# Patient Record
Sex: Female | Born: 1975 | Race: Black or African American | Hispanic: No | Marital: Married | Smoking: Never smoker
Health system: Southern US, Community
[De-identification: ages and names within clinical notes are randomized; demographics above are authoritative.]

---

## 2001-01-15 ENCOUNTER — Inpatient Hospital Stay (HOSPITAL_COMMUNITY): Admission: AD | Admit: 2001-01-15 | Discharge: 2001-01-15 | Payer: Self-pay | Admitting: *Deleted

## 2002-12-20 ENCOUNTER — Encounter: Payer: Self-pay | Admitting: Emergency Medicine

## 2002-12-20 ENCOUNTER — Emergency Department (HOSPITAL_COMMUNITY): Admission: EM | Admit: 2002-12-20 | Discharge: 2002-12-20 | Payer: Self-pay | Admitting: Emergency Medicine

## 2005-08-08 ENCOUNTER — Other Ambulatory Visit: Admission: RE | Admit: 2005-08-08 | Discharge: 2005-08-08 | Payer: Self-pay | Admitting: Obstetrics and Gynecology

## 2006-02-05 ENCOUNTER — Ambulatory Visit: Payer: Self-pay | Admitting: Sports Medicine

## 2006-02-21 ENCOUNTER — Ambulatory Visit: Payer: Self-pay | Admitting: Family Medicine

## 2006-11-29 DIAGNOSIS — I10 Essential (primary) hypertension: Secondary | ICD-10-CM | POA: Insufficient documentation

## 2006-11-29 DIAGNOSIS — E669 Obesity, unspecified: Secondary | ICD-10-CM | POA: Insufficient documentation

## 2006-11-29 DIAGNOSIS — N949 Unspecified condition associated with female genital organs and menstrual cycle: Secondary | ICD-10-CM

## 2008-06-26 ENCOUNTER — Emergency Department (HOSPITAL_COMMUNITY): Admission: EM | Admit: 2008-06-26 | Discharge: 2008-06-26 | Payer: Self-pay | Admitting: Family Medicine

## 2009-04-13 ENCOUNTER — Ambulatory Visit: Payer: Self-pay | Admitting: Family Medicine

## 2009-04-13 DIAGNOSIS — Q828 Other specified congenital malformations of skin: Secondary | ICD-10-CM | POA: Insufficient documentation

## 2009-07-23 ENCOUNTER — Ambulatory Visit: Payer: Self-pay | Admitting: Family Medicine

## 2009-07-23 ENCOUNTER — Encounter: Payer: Self-pay | Admitting: Family Medicine

## 2009-07-23 DIAGNOSIS — R519 Headache, unspecified: Secondary | ICD-10-CM | POA: Insufficient documentation

## 2009-07-23 DIAGNOSIS — R51 Headache: Secondary | ICD-10-CM

## 2009-07-23 LAB — CONVERTED CEMR LAB
BUN: 13 mg/dL (ref 6–23)
CO2: 24 meq/L (ref 19–32)
Calcium: 9.6 mg/dL (ref 8.4–10.5)
Chloride: 103 meq/L (ref 96–112)
Creatinine, Ser: 0.85 mg/dL (ref 0.40–1.20)
Glucose, Bld: 100 mg/dL — ABNORMAL HIGH (ref 70–99)
Potassium: 4.3 meq/L (ref 3.5–5.3)
Sodium: 138 meq/L (ref 135–145)

## 2009-07-26 ENCOUNTER — Encounter: Payer: Self-pay | Admitting: Family Medicine

## 2010-11-13 ENCOUNTER — Encounter: Payer: Self-pay | Admitting: *Deleted

## 2010-12-19 ENCOUNTER — Inpatient Hospital Stay (INDEPENDENT_AMBULATORY_CARE_PROVIDER_SITE_OTHER)
Admission: RE | Admit: 2010-12-19 | Discharge: 2010-12-19 | Disposition: A | Discharge status: Home / Self Care | Payer: Self-pay | Source: Ambulatory Visit | Attending: Emergency Medicine | Admitting: Emergency Medicine

## 2010-12-19 DIAGNOSIS — M9390 Osteochondropathy, unspecified of unspecified site: Secondary | ICD-10-CM

## 2010-12-19 DIAGNOSIS — R071 Chest pain on breathing: Secondary | ICD-10-CM

## 2013-01-24 ENCOUNTER — Other Ambulatory Visit (HOSPITAL_COMMUNITY)
Admission: RE | Admit: 2013-01-24 | Discharge: 2013-01-24 | Disposition: A | Discharge status: Home / Self Care | Payer: BC Managed Care – PPO | Source: Ambulatory Visit | Attending: Internal Medicine | Admitting: Internal Medicine

## 2013-01-24 ENCOUNTER — Other Ambulatory Visit: Payer: Self-pay

## 2013-01-24 DIAGNOSIS — Z01419 Encounter for gynecological examination (general) (routine) without abnormal findings: Secondary | ICD-10-CM | POA: Insufficient documentation

## 2015-06-13 ENCOUNTER — Ambulatory Visit: Payer: 59

## 2018-10-25 ENCOUNTER — Other Ambulatory Visit: Payer: Self-pay | Admitting: Podiatry

## 2018-10-25 DIAGNOSIS — M7660 Achilles tendinitis, unspecified leg: Secondary | ICD-10-CM

## 2018-11-04 ENCOUNTER — Other Ambulatory Visit: Payer: Self-pay | Admitting: Podiatry

## 2018-11-09 ENCOUNTER — Ambulatory Visit
Admission: RE | Admit: 2018-11-09 | Discharge: 2018-11-09 | Disposition: A | Discharge status: Home / Self Care | Payer: 59 | Source: Ambulatory Visit | Attending: Podiatry | Admitting: Podiatry

## 2018-11-09 DIAGNOSIS — M7660 Achilles tendinitis, unspecified leg: Secondary | ICD-10-CM

## 2021-10-14 ENCOUNTER — Other Ambulatory Visit: Payer: Self-pay | Admitting: Registered Nurse

## 2021-10-14 DIAGNOSIS — E785 Hyperlipidemia, unspecified: Secondary | ICD-10-CM

## 2021-11-21 ENCOUNTER — Encounter: Payer: Self-pay | Admitting: Neurology

## 2021-11-21 ENCOUNTER — Ambulatory Visit (INDEPENDENT_AMBULATORY_CARE_PROVIDER_SITE_OTHER): Payer: 59 | Admitting: Neurology

## 2021-11-21 ENCOUNTER — Other Ambulatory Visit: Payer: Self-pay | Admitting: Gastroenterology

## 2021-11-21 VITALS — BP 152/109 | HR 72 | Ht 64.0 in | Wt 366.0 lb

## 2021-11-21 DIAGNOSIS — Z1211 Encounter for screening for malignant neoplasm of colon: Secondary | ICD-10-CM | POA: Insufficient documentation

## 2021-11-21 DIAGNOSIS — H4710 Unspecified papilledema: Secondary | ICD-10-CM | POA: Diagnosis not present

## 2021-11-21 DIAGNOSIS — B9689 Other specified bacterial agents as the cause of diseases classified elsewhere: Secondary | ICD-10-CM | POA: Insufficient documentation

## 2021-11-21 DIAGNOSIS — N760 Acute vaginitis: Secondary | ICD-10-CM | POA: Insufficient documentation

## 2021-11-21 DIAGNOSIS — N870 Mild cervical dysplasia: Secondary | ICD-10-CM | POA: Insufficient documentation

## 2021-11-21 NOTE — Patient Instructions (Signed)
MRI Brain with and without contrast  MRV Head  Will contact after the results, if they are non-revealing, we will proceed with the lumbar puncture  Return to clinic in 6 months

## 2021-11-21 NOTE — Progress Notes (Signed)
[facility]  PATIENT: Jennifer Henderson DOB: 01-Nov-1975  REQUESTING CLINICIAN: [facility]* HISTORY FROM: Patient  REASON FOR VISIT: Papilledema    HISTORICAL  CHIEF COMPLAINT:  Chief Complaint  Patient presents with   New Patient (Initial Visit)    Rm 12. Alone. NP/Paper/[facility]/Joel Sherral Hammers OD/pseudopapilledema of bilateral optic discs    HISTORY OF PRESENT ILLNESS:  This is a 46 year old woman past medical history of obesity, low vitamin D vitamin D deficiency who was referred by ophthalmology for bilateral pseudopapilledema of optic disc.  Currently patient denies any symptoms, denies any headaches, no back pain, denies any visual obscuration and no complaint.  She did report occasional headache but nothing to send him to the hospital.  She reports seeing ophthalmology for her annual checkup, reported sleep is okay, she does snore but was never told that she has apnea.  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  No other complaints.  OTHER MEDICAL CONDITIONS: Low  Vitamin   REVIEW OF SYSTEMS: Full 14 system review of systems performed and negative with exception of: as noted in the HPI   ALLERGIES: Allergies  Allergen Reactions   Latex     Other reaction(s): itching/ swollwn eyes    HOME MEDICATIONS: Outpatient Medications Prior to Visit  Medication Sig Dispense Refill   Vitamin D, Ergocalciferol, (DRISDOL) 1.25 MG (50000 UNIT) CAPS capsule Take 1 tablet by mouth once a week.     ammonium lactate (LAC-HYDRIN) 12 % lotion Apply topically 2 (two) times daily as needed. to affected areas      No facility-administered medications prior to visit.    PAST MEDICAL HISTORY: History reviewed. No pertinent past medical history.  PAST SURGICAL HISTORY: History reviewed. No pertinent surgical history.  FAMILY HISTORY: Family History  Problem Relation Age of Onset   High blood pressure Mother     SOCIAL  HISTORY: Social History   Socioeconomic History   Marital status: Married    Spouse name: Not on file   Number of children: Not on file   Years of education: Not on file   Highest education level: Not on file  Occupational History   Not on file  Tobacco Use   Smoking status: Never   Smokeless tobacco: Never  Substance and Sexual Activity   Alcohol use: Yes    Comment: occ   Drug use: Not on file   Sexual activity: Not on file  Other Topics Concern   Not on file  Social History Narrative   Not on file   Social Determinants of Health   Financial Resource Strain: Not on file  Food Insecurity: Not on file  Transportation Needs: Not on file  Physical Activity: Not on file  Stress: Not on file  Social Connections: Not on file  Intimate Partner Violence: Not on file    PHYSICAL EXAM  GENERAL EXAM/CONSTITUTIONAL: Vitals:  Vitals:   11/21/21 0933 11/21/21 0937  BP: (!) 150/102 (!) 152/109  Pulse: 66 72  Weight: (!) 366 lb (166 kg)   Height: 5\' 4"  (1.626 m)    Body mass index is 62.82 kg/m. Wt Readings from Last 3 Encounters:  11/21/21 (!) 366 lb (166 kg)   Patient is in no distress; well developed, nourished and groomed; neck is supple  CARDIOVASCULAR: Examination of carotid arteries is normal; no carotid bruits Regular rate and rhythm, no murmurs Examination of peripheral vascular system by observation and palpation is normal  EYES: Pupils round and reactive to light, Visual fields full to confrontation, Extraocular movements intacts,   MUSCULOSKELETAL: Gait, strength, tone, movements noted in Neurologic exam below  NEUROLOGIC: MENTAL STATUS:  No flowsheet data found. awake, alert, oriented to person, place and time recent and remote memory intact normal attention and concentration language fluent, comprehension intact, naming intact fund of knowledge appropriate  CRANIAL NERVE:  Attempted, unable to visualized optic discs.  2nd, 3rd, 4th, 6th -  pupils equal and reactive to light, visual fields full to confrontation, extraocular muscles intact, no nystagmus 5th - facial sensation symmetric 7th - facial strength symmetric 8th - hearing intact 9th - palate elevates symmetrically, uvula midline 11th - shoulder shrug symmetric 12th - tongue protrusion midline  MOTOR:  normal bulk and tone, full strength in the BUE, BLE  SENSORY:  normal and symmetric to light touch, pinprick, temperature, vibration  COORDINATION:  finger-nose-finger, fine finger movements normal  REFLEXES:  deep tendon reflexes present and symmetric  GAIT/STATION:  normal     DIAGNOSTIC DATA (LABS, IMAGING, TESTING) - I reviewed patient records, labs, notes, testing and imaging myself where available.  No results found for: WBC, HGB, HCT, MCV, PLT    Component Value Date/Time   NA 138 07/23/2009 2112   K 4.3 07/23/2009 2112   CL 103 07/23/2009 2112   CO2 24 07/23/2009 2112   GLUCOSE 100 (H) 07/23/2009 2112   BUN 13 07/23/2009 2112   CREATININE 0.85 07/23/2009 2112   CALCIUM 9.6 07/23/2009 2112   No results found for: CHOL, HDL, LDLCALC, LDLDIRECT, TRIG, CHOLHDL No results found for: HGBA1C No results found for: VITAMINB12 No results found for: TSH   ASSESSMENT AND PLAN  46 y.o. year old female with obesity, vitamin D deficiency who was referred by ophthalmology for pseudopapilledema of bilateral optic discs.  Patient denies any symptoms at the moment, denies any headaches, no back pain, no visual obscuration.  She did have occasional headache in the past but nothing to send her to the hospital or emergency department.  I attempted funduscopic exam but unable to visualize optic discs as patient was blinking and unable to tolerate.  I will go ahead and order a MRI brain with and without contrast and MRV.  If both tests are unrevealing then I will proceed with a lumbar puncture for diagnostic and therapeutic.  If imaging and lumbar puncture  confirmed the diagnosis of idiopathic intracranial hypertension, I will start the patient on either Diamox or topiramate.  I will see her in 6 months for follow-up but I will contact her to order the LP after completion of the brain imaging.  Return sooner if worse.   1. Papilledema   2. Morbid obesity [facility])      Patient Instructions  MRI Brain with and without contrast  MRV Head  Will contact after the results, if they are non-revealing, we will proceed with the lumbar puncture  Return to clinic in 6 months    Orders Placed This Encounter  Procedures   MR BRAIN W WO CONTRAST   MR MRV HEAD WO CM    No orders of the defined types were placed in this encounter.   Return in about 6 months (around 05/21/2022).    Alric Ran, MD 11/21/2021, 10:13 AM  [facility] [address], [place], [place] [address] [phone]

## 2021-11-24 ENCOUNTER — Ambulatory Visit
Admission: RE | Admit: 2021-11-24 | Discharge: 2021-11-24 | Disposition: A | Discharge status: Home / Self Care | Payer: No Typology Code available for payment source | Source: Ambulatory Visit | Attending: Registered Nurse | Admitting: Registered Nurse

## 2021-11-24 DIAGNOSIS — E785 Hyperlipidemia, unspecified: Secondary | ICD-10-CM

## 2021-11-28 ENCOUNTER — Telehealth: Payer: Self-pay | Admitting: Neurology

## 2021-11-28 NOTE — Telephone Encounter (Signed)
MR Brain w/wo contrast & MRV Head wo contrast Dr. Edyth Gunnels auth: NPR Ref # [id number]. Patient is scheduled at [facility] for 12/07/21.

## 2021-12-07 ENCOUNTER — Ambulatory Visit: Payer: 59

## 2021-12-07 ENCOUNTER — Other Ambulatory Visit: Payer: Self-pay | Admitting: Neurology

## 2021-12-07 DIAGNOSIS — H4710 Unspecified papilledema: Secondary | ICD-10-CM

## 2021-12-12 ENCOUNTER — Telehealth: Payer: Self-pay | Admitting: *Deleted

## 2021-12-12 ENCOUNTER — Other Ambulatory Visit: Payer: Self-pay | Admitting: Neurology

## 2021-12-12 DIAGNOSIS — H4710 Unspecified papilledema: Secondary | ICD-10-CM

## 2021-12-12 NOTE — Telephone Encounter (Signed)
-----   Message from Windell Norfolk, MD sent at 12/12/2021 11:38 AM EDT ----- ?Please call and advise the patient that the recent MRI Brain and MRV Head we did were within normal limits. In particular, there were no acute findings, such as a stroke, or mass or blood products.  ?Please inform patient that I have ordered a diagnostic FG lumbar puncture. This test will help [place] confirm the diagnosis of idiopathic intracranial hypertension which can cause papilledema.  ?Please remind patient to keep any upcoming appointments or tests and to call us with any interim questions, concerns, problems or updates. Thanks,  ? ?Windell Norfolk, MD ? ? ?

## 2021-12-12 NOTE — Progress Notes (Signed)
Please call and advise the patient that the recent MRI Brain and MRV Head we did were within normal limits. In particular, there were no acute findings, such as a stroke, or mass or blood products.  ?Please inform patient that I have ordered a diagnostic FG lumbar puncture. This test will help [place] confirm the diagnosis of idiopathic intracranial hypertension which can cause papilledema.  ?Please remind patient to keep any upcoming appointments or tests and to call us with any interim questions, concerns, problems or updates. Thanks,  ? ?Windell Norfolk, MD ? ?

## 2021-12-12 NOTE — Telephone Encounter (Signed)
I spoke to the patient and her sister (on [place]). Provided them with the results below. She is willing to proceed with the ordered LP. She is aware to expect a call for scheduling from [facility]. ?

## 2021-12-20 ENCOUNTER — Telehealth: Payer: Self-pay | Admitting: Neurology

## 2021-12-20 NOTE — Telephone Encounter (Signed)
Sent a message to [place] to reach out to the patient to schedule her LP.  ?

## 2022-01-31 ENCOUNTER — Encounter (HOSPITAL_COMMUNITY): Payer: Self-pay | Admitting: Gastroenterology

## 2022-02-03 ENCOUNTER — Ambulatory Visit (HOSPITAL_COMMUNITY)
Admission: RE | Admit: 2022-02-03 | Discharge: 2022-02-03 | Disposition: A | Discharge status: Home / Self Care | Payer: Commercial Managed Care - PPO | Attending: Gastroenterology | Admitting: Gastroenterology

## 2022-02-03 ENCOUNTER — Ambulatory Visit (HOSPITAL_BASED_OUTPATIENT_CLINIC_OR_DEPARTMENT_OTHER): Payer: Commercial Managed Care - PPO | Admitting: Anesthesiology

## 2022-02-03 ENCOUNTER — Encounter (HOSPITAL_COMMUNITY): Payer: Self-pay | Admitting: Gastroenterology

## 2022-02-03 ENCOUNTER — Encounter (HOSPITAL_COMMUNITY)
Admission: RE | Disposition: A | Discharge status: Home / Self Care | Payer: Self-pay | Source: Home / Self Care | Attending: Gastroenterology

## 2022-02-03 ENCOUNTER — Other Ambulatory Visit: Payer: Self-pay

## 2022-02-03 ENCOUNTER — Ambulatory Visit (HOSPITAL_COMMUNITY): Payer: Commercial Managed Care - PPO | Admitting: Anesthesiology

## 2022-02-03 DIAGNOSIS — K5730 Diverticulosis of large intestine without perforation or abscess without bleeding: Secondary | ICD-10-CM | POA: Insufficient documentation

## 2022-02-03 DIAGNOSIS — I10 Essential (primary) hypertension: Secondary | ICD-10-CM

## 2022-02-03 DIAGNOSIS — Z6841 Body Mass Index (BMI) 40.0 and over, adult: Secondary | ICD-10-CM | POA: Diagnosis not present

## 2022-02-03 DIAGNOSIS — Z1211 Encounter for screening for malignant neoplasm of colon: Secondary | ICD-10-CM

## 2022-02-03 DIAGNOSIS — Z800 Family history of malignant neoplasm of digestive organs: Secondary | ICD-10-CM | POA: Diagnosis not present

## 2022-02-03 HISTORY — PX: COLONOSCOPY WITH PROPOFOL: SHX5780

## 2022-02-03 SURGERY — COLONOSCOPY WITH PROPOFOL
Anesthesia: Monitor Anesthesia Care

## 2022-02-03 MED ORDER — PROPOFOL 500 MG/50ML IV EMUL
INTRAVENOUS | Status: DC | PRN
  Administered 2022-02-03: 50 mg via INTRAVENOUS

## 2022-02-03 MED ORDER — PROPOFOL 500 MG/50ML IV EMUL
INTRAVENOUS | Status: DC | PRN
  Administered 2022-02-03: 100 ug/kg/min via INTRAVENOUS

## 2022-02-03 MED ORDER — LACTATED RINGERS IV SOLN: INTRAVENOUS | Status: DC | PRN

## 2022-02-03 MED ORDER — LIDOCAINE HCL (CARDIAC) PF 100 MG/5ML IV SOSY
PREFILLED_SYRINGE | INTRAVENOUS | Status: DC | PRN
  Administered 2022-02-03: 80 mg via INTRAVENOUS

## 2022-02-03 MED ORDER — ONDANSETRON HCL 4 MG/2ML IJ SOLN
INTRAMUSCULAR | Status: DC | PRN
  Administered 2022-02-03: 4 mg via INTRAVENOUS

## 2022-02-03 SURGICAL SUPPLY — 22 items
ELECT REM PT RETURN 9FT ADLT (ELECTROSURGICAL)
ELECTRODE REM PT RTRN 9FT ADLT (ELECTROSURGICAL) IMPLANT
FCP BXJMBJMB 240X2.8X (CUTTING FORCEPS)
FLOOR PAD 36X40 (MISCELLANEOUS) ×2
FORCEPS BIOP RAD 4 LRG CAP 4 (CUTTING FORCEPS) IMPLANT
FORCEPS BIOP RJ4 240 W/NDL (CUTTING FORCEPS)
FORCEPS BXJMBJMB 240X2.8X (CUTTING FORCEPS) IMPLANT
INJECTOR/SNARE I SNARE (MISCELLANEOUS) IMPLANT
LUBRICANT JELLY 4.5OZ STERILE (MISCELLANEOUS) IMPLANT
MANIFOLD NEPTUNE II (INSTRUMENTS) IMPLANT
NDL SCLEROTHERAPY 25GX240 (NEEDLE) IMPLANT
NEEDLE SCLEROTHERAPY 25GX240 (NEEDLE) IMPLANT
PAD FLOOR 36X40 (MISCELLANEOUS) ×2 IMPLANT
PROBE APC STR FIRE (PROBE) IMPLANT
PROBE INJECTION GOLD (MISCELLANEOUS)
PROBE INJECTION GOLD 7FR (MISCELLANEOUS) IMPLANT
SNARE ROTATE MED OVAL 20MM (MISCELLANEOUS) IMPLANT
SYR 50ML LL SCALE MARK (SYRINGE) IMPLANT
TRAP SPECIMEN MUCOUS 40CC (MISCELLANEOUS) IMPLANT
TUBING ENDO SMARTCAP PENTAX (MISCELLANEOUS) IMPLANT
TUBING IRRIGATION ENDOGATOR (MISCELLANEOUS) ×3 IMPLANT
WATER STERILE IRR 1000ML POUR (IV SOLUTION) IMPLANT

## 2022-02-03 NOTE — Anesthesia Postprocedure Evaluation (Signed)
Anesthesia Post Note ? ?Patient: Jennifer Henderson ? ?Procedure(s) Performed: COLONOSCOPY WITH PROPOFOL ? ?  ? ?Patient location during evaluation: Endoscopy ?Anesthesia Type: MAC ?Level of consciousness: awake and alert, patient cooperative and oriented ?Pain management: pain level controlled ?Vital Signs Assessment: post-procedure vital signs reviewed and stable ?Respiratory status: spontaneous breathing, nonlabored ventilation and respiratory function stable ?Cardiovascular status: stable and blood pressure returned to baseline ?Postop Assessment: no apparent nausea or vomiting, adequate PO intake and able to ambulate ?Anesthetic complications: no ? ? ?No notable events documented. ? ?Last Vitals:  ?Vitals:  ? 02/03/22 1017 02/03/22 1030  ?BP: 138/78 (!) 145/97  ?Pulse: 76 60  ?Resp: (!) 21 17  ?Temp: (!) 36.4 ?C   ?SpO2: 100% 100%  ?  ?Last Pain:  ?Vitals:  ? 02/03/22 1030  ?TempSrc:   ?PainSc: 0-No pain  ? ? ?  ?  ?  ?  ?  ?  ? ?Raylie Maddison,E. Josalynn Johndrow ? ? ? ? ?

## 2022-02-03 NOTE — Anesthesia Preprocedure Evaluation (Addendum)
Anesthesia Evaluation  ?Patient identified by MRN, date of birth, ID band ?Patient awake ? ? ? ?Reviewed: ?Allergy & Precautions, NPO status , Patient's Chart, lab work & pertinent test results ? ?History of Anesthesia Complications ?Negative for: history of anesthetic complications ? ?Airway ?Mallampati: I ? ?TM Distance: >3 FB ?Neck ROM: Full ? ? ? Dental ? ?(+) Dental Advisory Given ?  ?Pulmonary ?neg pulmonary ROS,  ?  ?breath sounds clear to auscultation ? ? ? ? ? ? Cardiovascular ?hypertension (no meds, followed by primary care),  ?Rhythm:Regular Rate:Normal ? ? ?  ?Neuro/Psych ? Headaches,   ? GI/Hepatic ?Neg liver ROS,   ?Endo/Other  ?Morbid obesity ? Renal/GU ?negative Renal ROS  ? ?  ?Musculoskeletal ? ? Abdominal ?(+) + obese,   ?Peds ? Hematology ?negative hematology ROS ?(+)   ?Anesthesia Other Findings ? ? Reproductive/Obstetrics ?[place] ? ?  ? ? ? ? ? ? ? ? ? ? ? ? ? ?  ?  ? ? ? ? ? ? ? ?Anesthesia Physical ?Anesthesia Plan ? ?ASA: 3 ? ?Anesthesia Plan: MAC  ? ?Post-op Pain Management: Minimal or no pain anticipated  ? ?Induction:  ? ?PONV Risk Score and Plan: 2 and Treatment may vary due to age or medical condition ? ?Airway Management Planned: Simple Face Mask and Natural Airway ? ?Additional Equipment:  ? ?Intra-op Plan:  ? ?Post-operative Plan:  ? ?Informed Consent: I have reviewed the patients History and Physical, chart, labs and discussed the procedure including the risks, benefits and alternatives for the proposed anesthesia with the patient or authorized representative who has indicated his/her understanding and acceptance.  ? ? ? ?Dental advisory given ? ?Plan Discussed with: CRNA and Surgeon ? ?Anesthesia Plan Comments:   ? ? ? ? ? ?Anesthesia Quick Evaluation ? ?

## 2022-02-03 NOTE — Discharge Instructions (Signed)

## 2022-02-03 NOTE — H&P (Signed)
Fletcher Anon ?HPI: This 46 year old black female presents to the office for colorectal cancer screening. She has 2-3 BM's per day with no obvious blood or mucus in the stool. She has a good appetite and her weight has been stable. She denies having any complaints of abdominal pain, nausea, vomiting, acid reflux, dysphagia or odynophagia. She denies having a family history of celiac sprue or IBD. Her maternal grandmother had colon cancer but the age of diagnosis is not known.  ? ?History reviewed. No pertinent past medical history. ? ?History reviewed. No pertinent surgical history. ? ?Family History  ?Problem Relation Age of Onset  ? High blood pressure Mother   ? ? ?Social History:  reports that she has never smoked. She has never used smokeless tobacco. She reports current alcohol use. No history on file for drug use. ? ?Allergies:  ?Allergies  ?Allergen Reactions  ? Latex Itching and Swelling  ?  Other reaction(s): itching/ swollen eyes  ? ? ?Medications: Scheduled: ?Continuous: ? ?No results found for this or any previous visit (from the past 24 hour(s)).  ? ?No results found. ? ?ROS:  As stated above in the HPI otherwise negative. ? ?Blood pressure (!) 159/103, pulse 63, temperature 97.8 ?F (36.6 ?C), temperature source Temporal, resp. rate 18, height 5\' 4"  (1.626 m), weight (!) 164.7 kg, SpO2 96 %.   ? ?PE: ?Gen: NAD, Alert and Oriented ?HEENT:  [place]/AT, EOMI ?Neck: Supple, no LAD ?Lungs: CTA Bilaterally ?CV: RRR without M/G/R ?ABD: Soft, NTND, +BS ?Ext: No C/C/E ? ?Assessment/Plan: ?1) Screening colonoscopy. ? ?Mohammed Mcandrew D ?02/03/2022, 9:44 AM  ? ? ?  ? ?

## 2022-02-03 NOTE — Transfer of Care (Signed)
Immediate Anesthesia Transfer of Care Note ? ?Patient: Jennifer Henderson ? ?Procedure(s) Performed: Procedure(s): ?COLONOSCOPY WITH PROPOFOL (N/A) ? ?Patient Location: PACU ? ?Anesthesia Type:MAC ? ?Level of Consciousness:  sedated, patient cooperative and responds to stimulation ? ?Airway & Oxygen Therapy:Patient Spontanous Breathing and Patient connected to face mask oxgen ? ?Post-op Assessment:  Report given to PACU RN and Post -op Vital signs reviewed and stable ? ?Post vital signs:  Reviewed and stable ? ?Last Vitals:  ?Vitals:  ? 02/03/22 0933 02/03/22 1017  ?BP: (!) 159/103 138/78  ?Pulse: 63 76  ?Resp: 18 (!) 21  ?Temp: 36.6 ?C (!) 36.4 ?C  ?SpO2: 96% 100%  ? ? ?Complications: No apparent anesthesia complications ? ?

## 2022-02-03 NOTE — Op Note (Signed)
[facility] ?Patient Name: Jennifer Henderson ?Procedure Date: 02/03/2022 ?MRN: [id number] ?Attending MD: Jeani HawkingPatrick Rogena Deupree , MD ?Date of Birth: 1976-02-04 ?CSN: [id number] ?Age: 46 ?Admit Type: Inpatient ?Procedure:                Colonoscopy ?Indications:              Screening for colorectal malignant neoplasm ?Providers:                Jeani HawkingPatrick Kooper Godshall, MD, Benjaman LobeJoy Morgan, RN, Rozetta NunneryAnthony Gillies,  ?                          Technician ?Referring MD:              ?Medicines:                Propofol per Anesthesia ?Complications:            No immediate complications. ?Estimated Blood Loss:     Estimated blood loss: none. ?Procedure:                Pre-Anesthesia Assessment: ?                          - Prior to the procedure, a History and Physical  ?                          was performed, and patient medications and  ?                          allergies were reviewed. The patient's tolerance of  ?                          previous anesthesia was also reviewed. The risks  ?                          and benefits of the procedure and the sedation  ?                          options and risks were discussed with the patient.  ?                          All questions were answered, and informed consent  ?                          was obtained. Prior Anticoagulants: The patient has  ?                          taken no previous anticoagulant or antiplatelet  ?                          agents. ASA Grade Assessment: III - A patient with  ?                          severe systemic disease. After reviewing the risks  ?  and benefits, the patient was deemed in  ?                          satisfactory condition to undergo the procedure. ?                          - Sedation was administered by an anesthesia  ?                          professional. Deep sedation was attained. ?                          After obtaining informed consent, the colonoscope  ?                          was passed under direct  vision. Throughout the  ?                          procedure, the patient's blood pressure, pulse, and  ?                          oxygen saturations were monitored continuously. The  ?                          CF-HQ190L ([id number]) Olympus colonoscope was  ?                          introduced through the anus and advanced to the the  ?                          cecum, identified by appendiceal orifice and  ?                          ileocecal valve. The colonoscopy was performed  ?                          without difficulty. The patient tolerated the  ?                          procedure well. The quality of the bowel  ?                          preparation was evaluated using the BBPS [facility]  ?                          Bowel Preparation Scale) with scores of: Right  ?                          Colon = 3 (entire mucosa seen well with no residual  ?                          staining, small fragments of stool or opaque  ?  liquid), Transverse Colon = 3 (entire mucosa seen  ?                          well with no residual staining, small fragments of  ?                          stool or opaque liquid) and Left Colon = 3 (entire  ?                          mucosa seen well with no residual staining, small  ?                          fragments of stool or opaque liquid). The total  ?                          BBPS score equals 9. The quality of the bowel  ?                          preparation was good. The ileocecal valve,  ?                          appendiceal orifice, and rectum were photographed. ?Scope In: 9:58:32 AM ?Scope Out: 10:11:38 AM ?Scope Withdrawal Time: 0 hours 8 minutes 34 seconds  ?Total Procedure Duration: 0 hours 13 minutes 6 seconds  ?Findings: ?     A few medium-mouthed diverticula were found in the sigmoid colon and  ?     descending colon. ?Impression:               - Diverticulosis in the sigmoid colon and in the  ?                          descending colon. ?                           - No specimens collected. ?Moderate Sedation: ?     Not Applicable - Patient had care per Anesthesia. ?Recommendation:           - Patient has a contact number available for  ?                          emergencies. The signs and symptoms of potential  ?                          delayed complications were discussed with the  ?                          patient. Return to normal activities tomorrow.  ?                          Written discharge instructions were provided to the  ?                          patient. ?                          -  Resume previous diet. ?                          - Continue present medications. ?                          - Repeat colonoscopy in 10 years for screening  ?                          purposes. ?Procedure Code(s):        --- Professional --- ?                          [phone], Colonoscopy, flexible; diagnostic, including  ?                          collection of specimen(s) by brushing or washing,  ?                          when performed (separate procedure) ?Diagnosis Code(s):        --- Professional --- ?                          Z12.11, Encounter for screening for malignant  ?                          neoplasm of colon ?                          K57.30, Diverticulosis of large intestine without  ?                          perforation or abscess without bleeding ?CPT copyright 2019 [employer] Medical Association. All rights reserved. ?The codes documented in this report are preliminary and upon coder review may  ?be revised to meet current compliance requirements. ?Jeani Hawking, MD ?Jeani Hawking, MD ?02/03/2022 10:23:43 AM ?This report has been signed electronically. ?Number of Addenda: 0 ?

## 2022-05-22 ENCOUNTER — Ambulatory Visit: Payer: Commercial Managed Care - PPO | Admitting: Neurology

## 2023-06-10 IMAGING — CT CT CARDIAC CORONARY ARTERY CALCIUM SCORE
2 of 5 series · 7 of 20 positions shown, 9 images · non-contrast
Comparison: None.

CLINICAL DATA: 45-year-old African American female with history of
hyperlipidemia, hypertension and family history of heart disease.

EXAM:
CT CARDIAC CORONARY ARTERY CALCIUM SCORE
TECHNIQUE: Non-contrast imaging through the heart was performed using
prospective ECG gating. Image post processing was performed on an
independent workstation, allowing for quantitative analysis of the
heart and coronary arteries. Note that this exam targets the heart
and the chest was not imaged in its entirety.

[Series 2: calcium scoring 2.00 qr36 bestdiast 70% hrt calciu · axial · 0.39mm/px · z∈[+1665,+1705]mm · 2 of 62 slices shown]
[im 21/62  vessel]
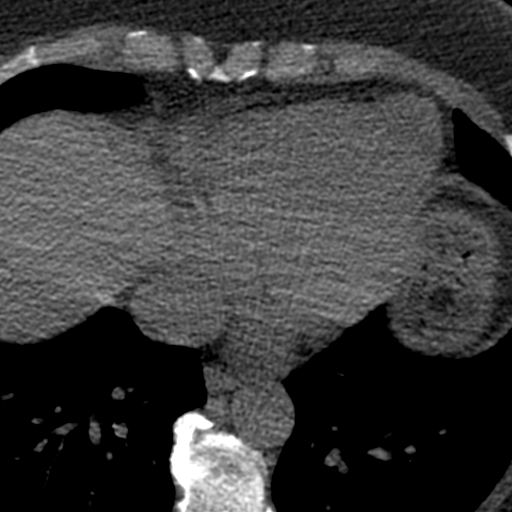
[im 41/62  vessel]
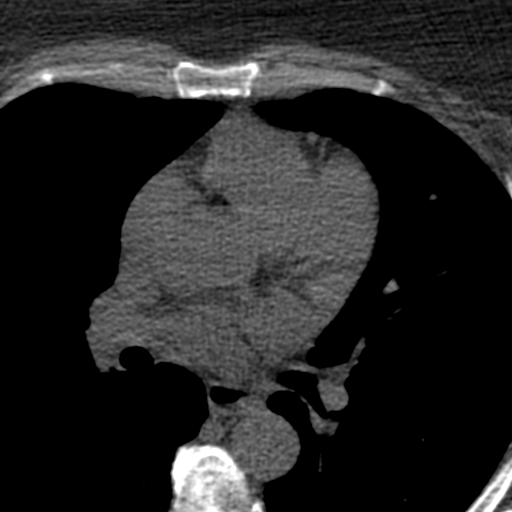

[Series 20: calcium scoring 1.50 qr36 bestdiast 72% thins · axial · 0.42mm/px · z∈[+1647,+1730]mm · 5 of 125 slices shown, 7 images]
[im 21/125  vessel]
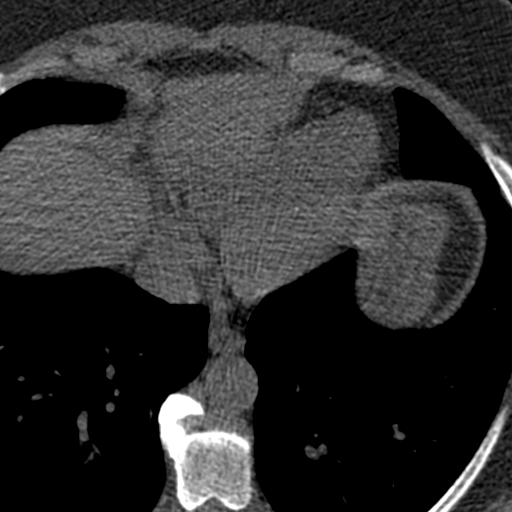
[im 21/125  lung]
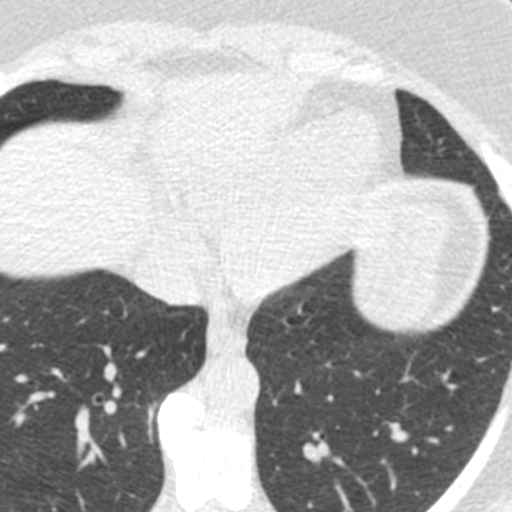
[im 42/125  vessel]
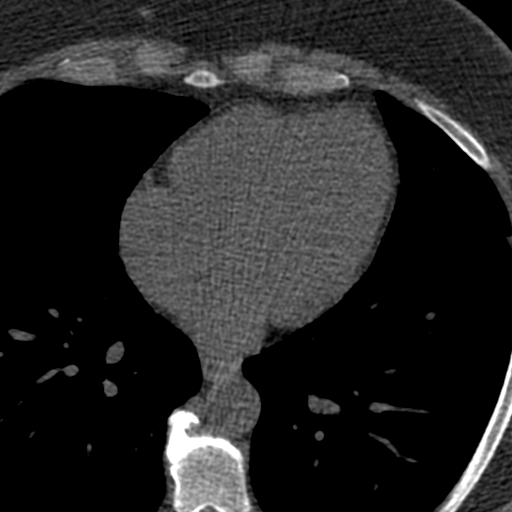
[im 63/125  vessel]
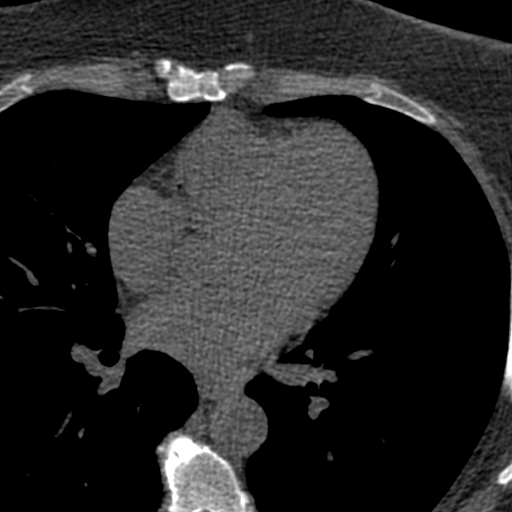
[im 83/125  vessel]
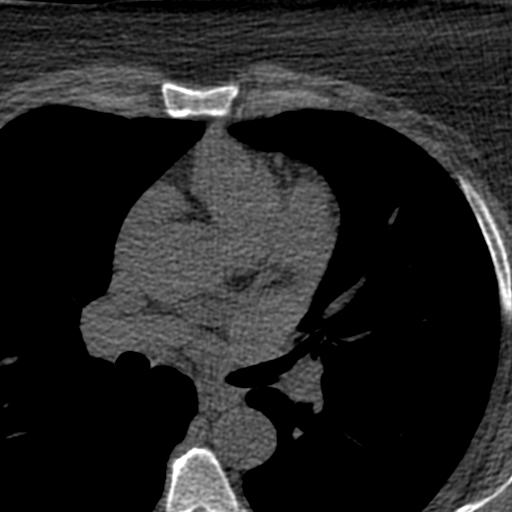
[im 104/125  vessel]
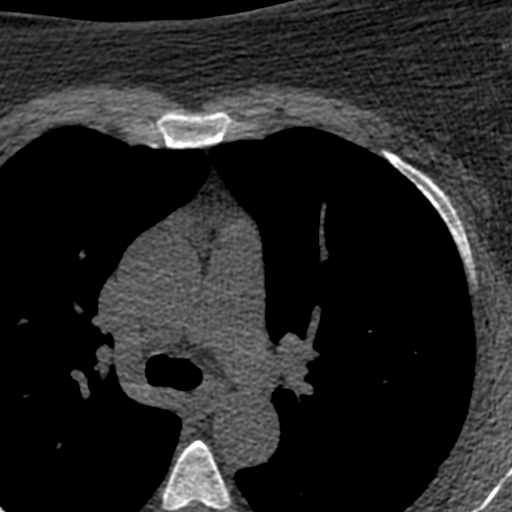
[im 104/125  lung]
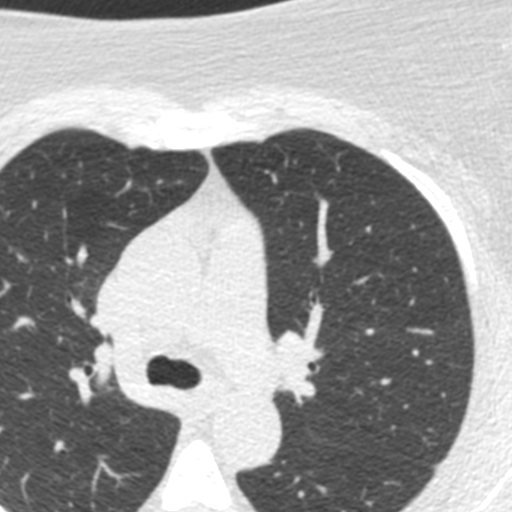

[7 of 20 positions shown; findings below may reference images not displayed]

FINDINGS: CORONARY CALCIUM SCORES:

Left Main: 0

LAD: 0

LCx: 0

RCA: 0

Total Agatston Score: 0

[HOSPITAL] percentile: 0

AORTA MEASUREMENTS:

Ascending Aorta: 32 mm

Descending Aorta: 26 mm

OTHER FINDINGS:

